# Patient Record
Sex: Male | Born: 1965 | Hispanic: Yes | Marital: Married | Smoking: Never smoker
Health system: Southern US, Community
[De-identification: ages and names within clinical notes are randomized; demographics above are authoritative.]

## PROBLEM LIST (undated history)

## (undated) DIAGNOSIS — E119 Type 2 diabetes mellitus without complications: Secondary | ICD-10-CM

## (undated) DIAGNOSIS — I10 Essential (primary) hypertension: Secondary | ICD-10-CM

---

## 2004-12-24 ENCOUNTER — Ambulatory Visit: Payer: Self-pay

## 2005-01-12 ENCOUNTER — Ambulatory Visit: Payer: Self-pay

## 2005-02-12 ENCOUNTER — Ambulatory Visit: Payer: Self-pay

## 2005-03-14 ENCOUNTER — Ambulatory Visit: Payer: Self-pay

## 2005-12-13 ENCOUNTER — Ambulatory Visit: Payer: Self-pay

## 2007-04-23 ENCOUNTER — Emergency Department: Payer: Self-pay | Admitting: Internal Medicine

## 2009-10-11 ENCOUNTER — Emergency Department: Payer: Self-pay | Admitting: Emergency Medicine

## 2011-01-10 ENCOUNTER — Emergency Department: Payer: Self-pay | Admitting: Emergency Medicine

## 2011-01-15 ENCOUNTER — Emergency Department: Payer: Self-pay | Admitting: Emergency Medicine

## 2013-09-13 ENCOUNTER — Emergency Department: Payer: Self-pay | Admitting: Emergency Medicine

## 2013-11-01 ENCOUNTER — Emergency Department: Payer: Self-pay | Admitting: Emergency Medicine

## 2013-11-01 LAB — COMPREHENSIVE METABOLIC PANEL
ALBUMIN: 3.8 g/dL (ref 3.4–5.0)
ANION GAP: 6 — AB (ref 7–16)
Alkaline Phosphatase: 70 U/L
BUN: 17 mg/dL (ref 7–18)
Bilirubin,Total: 0.2 mg/dL (ref 0.2–1.0)
CREATININE: 1.11 mg/dL (ref 0.60–1.30)
Calcium, Total: 9.1 mg/dL (ref 8.5–10.1)
Chloride: 101 mmol/L (ref 98–107)
Co2: 26 mmol/L (ref 21–32)
EGFR (African American): >60
EGFR (Non-African Amer.): >60
Glucose: 311 mg/dL — ABNORMAL HIGH (ref 65–99)
Osmolality: 280 (ref 275–301)
Potassium: 4.2 mmol/L (ref 3.5–5.1)
SGOT(AST): 26 U/L (ref 15–37)
SGPT (ALT): 46 U/L (ref 12–78)
SODIUM: 133 mmol/L — AB (ref 136–145)
Total Protein: 7.8 g/dL (ref 6.4–8.2)

## 2013-11-01 LAB — CBC
HCT: 39.7 % — AB (ref 40.0–52.0)
HGB: 13.7 g/dL (ref 13.0–18.0)
MCH: 27.8 pg (ref 26.0–34.0)
MCHC: 34.5 g/dL (ref 32.0–36.0)
MCV: 81 fL (ref 80–100)
PLATELETS: 167 10*3/uL (ref 150–440)
RBC: 4.94 10*6/uL (ref 4.40–5.90)
RDW: 13.3 % (ref 11.5–14.5)
WBC: 9.3 10*3/uL (ref 3.8–10.6)

## 2013-11-01 LAB — TROPONIN I: Troponin-I: <0.02 ng/mL

## 2015-05-02 ENCOUNTER — Encounter: Payer: Self-pay | Admitting: Emergency Medicine

## 2015-05-02 ENCOUNTER — Emergency Department: Payer: Self-pay

## 2015-05-02 ENCOUNTER — Other Ambulatory Visit: Payer: Self-pay

## 2015-05-02 ENCOUNTER — Emergency Department
Admission: EM | Admit: 2015-05-02 | Discharge: 2015-05-02 | Disposition: A | Discharge status: Home / Self Care | Payer: Self-pay | Attending: Emergency Medicine | Admitting: Emergency Medicine

## 2015-05-02 DIAGNOSIS — E119 Type 2 diabetes mellitus without complications: Secondary | ICD-10-CM | POA: Insufficient documentation

## 2015-05-02 DIAGNOSIS — G542 Cervical root disorders, not elsewhere classified: Secondary | ICD-10-CM | POA: Insufficient documentation

## 2015-05-02 DIAGNOSIS — M4802 Spinal stenosis, cervical region: Secondary | ICD-10-CM | POA: Insufficient documentation

## 2015-05-02 DIAGNOSIS — I10 Essential (primary) hypertension: Secondary | ICD-10-CM | POA: Insufficient documentation

## 2015-05-02 DIAGNOSIS — R51 Headache: Secondary | ICD-10-CM | POA: Insufficient documentation

## 2015-05-02 HISTORY — DX: Type 2 diabetes mellitus without complications: E11.9

## 2015-05-02 HISTORY — DX: Essential (primary) hypertension: I10

## 2015-05-02 MED ORDER — IBUPROFEN 800 MG PO TABS: 800.0000 mg | ORAL_TABLET | Freq: Three times a day (TID) | ORAL | Status: AC | PRN

## 2015-05-02 MED ORDER — HYDROCODONE-ACETAMINOPHEN 5-325 MG PO TABS: 1.0000 | ORAL_TABLET | ORAL | Status: DC | PRN

## 2015-05-02 NOTE — ED Provider Notes (Signed)
[facility] Emergency Department Provider Note  ____________________________________________  Time seen: Approximately 4:17 PM  I have reviewed the triage vital signs and the nursing notes. Interpretor [place]  HISTORY  Chief Complaint Neck Injury and Headache    HPI Steve Walsh is a 49 y.o. male who presents with complaints of neck and head pain times one week. Not sure why denies any trauma states that the pain is very severe. Has no known same job for 4 years with no change. Reports the pain is worse with more motion/movement. Rates pain as a 10 over 10   Past Medical History  Diagnosis Date  . Diabetes mellitus without complication   . Hypertension     There are no active problems to display for this patient.   History reviewed. No pertinent past surgical history.  Current Outpatient Rx  Name  Route  Sig  Dispense  Refill  . HYDROcodone-acetaminophen (NORCO) 5-325 MG per tablet   Oral   Take 1-2 tablets by mouth every 4 (four) hours as needed for moderate pain.   15 tablet   0   . ibuprofen (ADVIL,MOTRIN) 800 MG tablet   Oral   Take 1 tablet (800 mg total) by mouth every 8 (eight) hours as needed.   30 tablet   0     Allergies Review of patient's allergies indicates no known allergies.  History reviewed. No pertinent family history.  Social History Social History  Substance Use Topics  . Smoking status: Never Smoker   . Smokeless tobacco: None  . Alcohol Use: No    Review of Systems Constitutional: No fever/chills Eyes: No visual changes. ENT: No sore throat. Cardiovascular: Denies chest pain. Respiratory: Denies shortness of breath. Gastrointestinal: No abdominal pain.  No nausea, no vomiting.  No diarrhea.  No constipation. Genitourinary: Negative for dysuria. Musculoskeletal: Positive for neck and head pain.. Skin: Negative for rash. Neurological: Negative for headaches, focal weakness or  numbness.  10-point ROS otherwise negative.  ____________________________________________   PHYSICAL EXAM:  VITAL SIGNS: ED Triage Vitals  Enc Vitals Group     BP 05/02/15 1520 115/83 mmHg     Pulse Rate 05/02/15 1520 75     Resp 05/02/15 1520 18     Temp 05/02/15 1520 98.4 F (36.9 C)     Temp Source 05/02/15 1520 Oral     SpO2 05/02/15 1520 99 %     Weight 05/02/15 1520 162 lb (73.483 kg)     Height 05/02/15 1520  (1.753 m)     Head Cir --      Peak Flow --      Pain Score --      Pain Loc --      Pain Edu? --      Excl. in GC? --     Constitutional: Alert and oriented. Well appearing and in no acute distress. Eyes: Conjunctivae are normal. PERRL. EOMI. Head: Atraumatic. Nose: No congestion/rhinnorhea. Mouth/Throat: Mucous membranes are moist.  Oropharynx non-erythematous. Neck: No stridor.  Cervical tenderness. Full range of motion with increased pain in all directions abduction and adduction lateralization flexion and extension Cardiovascular: Normal rate, regular rhythm. Grossly normal heart sounds.  Good peripheral circulation. Respiratory: Normal respiratory effort.  No retractions. Lungs CTAB. Musculoskeletal: No lower extremity tenderness nor edema.  No joint effusions. Neurologic:  Normal speech and language. No gross focal neurologic deficits are appreciated. No gait instability. Skin:  Skin is warm, dry and intact. No rash noted.  Psychiatric: Mood and affect are normal. Speech and behavior are normal.  ____________________________________________   LABS (all labs ordered are listed, but only abnormal results are displayed)  Labs Reviewed - No data to display ____________________________________________  RADIOLOGY  Head and neck CT interpreted by radiologist reviewed by myself.   CT cervical spine: No fracture or spondylolisthesis. Calcified disc extrusion on the left at C5-6 with severe narrowing of the exit foramen on the left at C5-6. Both  of these extradural defects impress on the exiting nerve root on the left at C5-6. There is a smaller extradural defect at the exit foramen on the right at C3-4 impressing on the exiting nerve root modestly due to bony hypertrophy. No significant central stenosis appreciable.___________________________________________   PROCEDURES  Procedure(s) performed: None  Critical Care performed: No  ____________________________________________   INITIAL IMPRESSION / ASSESSMENT AND PLAN / ED COURSE  Pertinent labs & imaging results that were available during my care of the patient were reviewed by me and considered in my medical decision making (see chart for details).  Cervical stenosis with nerve impingement. We'll need to follow up with neurosurgery via PCP referral. Rx given for ibuprofen 800 mg 3 times a day and hydrocodone as needed for pain. Dischar instructions provide via interpretor Rafael.____________________________________________   FINAL CLINICAL IMPRESSION(S) / ED DIAGNOSES  Final diagnoses:  Cervical stenosis of spinal canal  Cervical nerve root impingement      Evangeline Dakin, PA-C 05/02/15 1807  Evangeline Dakin, PA-C 05/02/15 1825  Phineas Semen, MD 05/02/15 2150

## 2015-05-02 NOTE — ED Notes (Signed)
Patient to ED with report of neck and back of head pain, while he's at work feels it is caused by the [employer] at work, reports that it is very hot in his work. Further reports that pain is worse with movement.

## 2015-05-02 NOTE — Discharge Instructions (Signed)
Radiculopata cervical (Cervical Radiculopathy)  La radiculopata cervical se produce cuando un nervio del cuello se comprime o es desplazado un disco herniado o por cambios artrticos en los huesos de la columna cervical. Esto puede ocurrir debido a una lesin o como parte del proceso normal de envejecimiento. La presin [employer] nervios cervicales pueden causar dolor o adormecimiento que se extiende desde el cuello hacia los brazos y los dedos.  CAUSAS  Hay numerosas causas que [employer], entre las que se incluyen:   Traumatismos.  Rigidez de los msculos del cuello por el uso excesivo.  Articulaciones que duelen y que se hinchan (artritis).  Desgaste o degeneracin de los huesos y las articulaciones de la columna (espondilosis) debido al proceso de envejecimiento.  Espolones seos que pueden desarrollarse cerca de los nervios cervicales. SNTOMAS  Los sntomas son dolor, debilidad o adormecimiento en la mano y en el brazo afectados. El dolor puede ser intenso o irritante. Los sntomas pueden empeorar al extender o torcer el cuello.  DIAGNSTICO  El mdico le preguntar acerca de sus sntomas y le har un examen fsico. Warehouse manager de poner a prueba su fuerza y   sus reflejos. Le indicarn radiografas, una tomografa computada y Health visitor en caso de traumatismos o si los sntomas no desaparecen despus de cierto perodo de [place]. Podrn hacerle una electromiografa (EMG) o una prueba de conduccin nerviosa para estudiar el funcionamiento de sus nervios y msculos.  TRATAMIENTO  El mdico podr recomendar algunos ejercicios para [employer] sntomas. La radiculopata puede, y con frecuencia se logra, mejorar con el tiempo y un tratamiento. Si los sntomas continan, las opciones de tratamiento son:  Usar un collar blando durante un tiempo breve.  Fisioterapia para fortalecer los msculos del cuello.  Medicamentos, como los antinflamatorios no esteroideos (AINES),  corticoides por va oral o inyecciones en la columna vertebral.  Ciruga. Segn la causa del problema podrn implementarse diferentes tipos de [place]. INSTRUCCIONES PARA EL CUIDADO DOMICILIARIO  Aplique hielo sobre la zona afectada.  Ponga el hielo en una bolsa plstica.  Colquese una toalla entre la piel y la bolsa de hielo.  Deje el hielo durante 15 a 20 minutos 3 a 4 veces por da, o segn las indicaciones del mdico.  Si el hielo no ayuda, puede Charity fundraiser. Tome una ducha o bao caliente, o use una bolsa de agua caliente segn las indicaciones de su mdico.  Puede intentar con un masaje suave en el cuello y los hombros.  Por la noche duerma con una almohada plana.  Utilice los medicamentos de venta libre o de prescripcin para Chief Technology Officer, Environmental health practitioner o la [place], segn se lo indique el profesional que lo asiste.  Si le indican fisioterapia, siga las indicaciones de su mdico.  Si le indican un collar blando, selo segn las indicaciones. SOLICITE ATENCIN MDICA DE INMEDIATO SI:  El dolor empeora mucho y no puede controlarlo con medicamentos.  Siente debilidad o adormecimiento en la mano, el brazo, el rostro o la pierna.  Le sube la fiebre o tiene el cuello rgido.  Pierde el control del intestino o de la vejiga (incontinencia).  Tiene dificultad para caminar, para mantener el equilibrio o para hablar. EST SEGURO QUE:   Comprende estas instrucciones.  Controlar su enfermedad.  Solicitar ayuda de inmediato si no mejora o si empeora. Document Released: 06/10/2005 Document Revised: 11/23/2011 [facility] Patient Information 2015 [place], [place]. This information is not intended to replace advice given to you by your  health care provider. Make sure you discuss any questions you have with your health care provider.  Estenosis espinal  (Spinal Stenosis)  La estenosis espinal se produce cuando los espacios abiertos [employer] de la columna (vrtebras) se  hacen ms pequeos (se estrechan). Esto se produce por la presin de [employer] espacios abiertos de la columna. Esto ejerce presin en la columna y en los nervios de la columna. Le indicarn medicamentos para aliviar la hinchazn (inflamacin) en los nervios. En otros casos se requiere [place]. CUIDADOS EN EL HOGAR   Cambie de posicin mientras se encuentra sentado, de pie o acostado. Esto ayuda a Chief Strategy Officer presin [employer].  Haga ejercicios segn las indicaciones de su mdico para fortalecer la parte media de su cuerpo.  Baje de peso, si el mdico se lo indica. Esto quitar presin a la columna vertebral.  Tome todos los medicamentos segn las indicaciones de su mdico. ASEGRESE DE QUE:   Comprende estas instrucciones.  Controlar su afeccin.  Recibir ayuda de inmediato si no mejora o si empeora. Document Released: 01/21/2011 Document Revised: 05/03/2013 [facility] Patient Information 2015 [place], [place]. This information is not intended to replace advice given to you by your health care provider. Make sure you discuss any questions you have with your health care provider.

## 2015-05-02 NOTE — ED Notes (Signed)
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 The back of my head started hurting when I was working and I just didn't feel good." Pt denies injury. Pt c/o head pain. NAD noted. RR even and nonlabored.

## 2015-05-02 NOTE — ED Notes (Signed)
Patient transported to CT via stretcher.

## 2015-10-16 ENCOUNTER — Encounter: Payer: Self-pay | Admitting: Emergency Medicine

## 2015-10-16 ENCOUNTER — Emergency Department: Payer: Self-pay

## 2015-10-16 ENCOUNTER — Emergency Department
Admission: EM | Admit: 2015-10-16 | Discharge: 2015-10-17 | Disposition: A | Discharge status: Home / Self Care | Payer: Self-pay | Attending: Student | Admitting: Student

## 2015-10-16 DIAGNOSIS — R109 Unspecified abdominal pain: Secondary | ICD-10-CM

## 2015-10-16 DIAGNOSIS — R112 Nausea with vomiting, unspecified: Secondary | ICD-10-CM

## 2015-10-16 DIAGNOSIS — I10 Essential (primary) hypertension: Secondary | ICD-10-CM | POA: Insufficient documentation

## 2015-10-16 DIAGNOSIS — K859 Acute pancreatitis without necrosis or infection, unspecified: Secondary | ICD-10-CM | POA: Insufficient documentation

## 2015-10-16 DIAGNOSIS — E119 Type 2 diabetes mellitus without complications: Secondary | ICD-10-CM | POA: Insufficient documentation

## 2015-10-16 DIAGNOSIS — R52 Pain, unspecified: Secondary | ICD-10-CM

## 2015-10-16 LAB — TROPONIN I

## 2015-10-16 LAB — COMPREHENSIVE METABOLIC PANEL
ALT: 34 U/L (ref 17–63)
AST: 25 U/L (ref 15–41)
Albumin: 5 g/dL (ref 3.5–5.0)
Alkaline Phosphatase: 54 U/L (ref 38–126)
Anion gap: 13 (ref 5–15)
BUN: 13 mg/dL (ref 6–20)
CHLORIDE: 97 mmol/L — AB (ref 101–111)
CO2: 25 mmol/L (ref 22–32)
CREATININE: 0.83 mg/dL (ref 0.61–1.24)
Calcium: 10.6 mg/dL — ABNORMAL HIGH (ref 8.9–10.3)
GFR calc non Af Amer: >60 mL/min (ref >60)
Glucose, Bld: 308 mg/dL — ABNORMAL HIGH (ref 65–99)
POTASSIUM: 4.4 mmol/L (ref 3.5–5.1)
SODIUM: 135 mmol/L (ref 135–145)
Total Bilirubin: 0.5 mg/dL (ref 0.3–1.2)
Total Protein: 8.8 g/dL — ABNORMAL HIGH (ref 6.5–8.1)

## 2015-10-16 LAB — URINALYSIS COMPLETE WITH MICROSCOPIC (ARMC ONLY)
BACTERIA UA: NONE SEEN
BILIRUBIN URINE: NEGATIVE
Glucose, UA: >500 mg/dL — AB
Hgb urine dipstick: NEGATIVE
Ketones, ur: NEGATIVE mg/dL
Nitrite: NEGATIVE
PH: 8 (ref 5.0–8.0)
PROTEIN: NEGATIVE mg/dL
RBC / HPF: NONE SEEN RBC/hpf (ref 0–5)
SQUAMOUS EPITHELIAL / LPF: NONE SEEN
Specific Gravity, Urine: 1.02 (ref 1.005–1.030)

## 2015-10-16 LAB — CBC
HEMATOCRIT: 42.7 % (ref 40.0–52.0)
Hemoglobin: 14.4 g/dL (ref 13.0–18.0)
MCH: 27.2 pg (ref 26.0–34.0)
MCHC: 33.7 g/dL (ref 32.0–36.0)
MCV: 80.8 fL (ref 80.0–100.0)
PLATELETS: 179 10*3/uL (ref 150–440)
RBC: 5.28 MIL/uL (ref 4.40–5.90)
RDW: 13.1 % (ref 11.5–14.5)
WBC: 5.5 10*3/uL (ref 3.8–10.6)

## 2015-10-16 LAB — LIPASE, BLOOD: LIPASE: 77 U/L — AB (ref 11–51)

## 2015-10-16 MED ORDER — OXYCODONE HCL 5 MG PO TABS: 5.0000 mg | ORAL_TABLET | Freq: Four times a day (QID) | ORAL | Status: AC | PRN

## 2015-10-16 MED ORDER — FENTANYL CITRATE (PF) 100 MCG/2ML IJ SOLN
50.0000 ug | Freq: Once | INTRAMUSCULAR | Status: AC
  Administered 2015-10-16: 50 ug via INTRAVENOUS
  Filled 2015-10-16: qty 2

## 2015-10-16 MED ORDER — ONDANSETRON HCL 4 MG PO TABS: 4.0000 mg | ORAL_TABLET | Freq: Three times a day (TID) | ORAL | Status: AC | PRN

## 2015-10-16 MED ORDER — ONDANSETRON HCL 4 MG/2ML IJ SOLN
4.0000 mg | Freq: Once | INTRAMUSCULAR | Status: AC | PRN
  Administered 2015-10-16: 4 mg via INTRAVENOUS
  Filled 2015-10-16: qty 2

## 2015-10-16 NOTE — ED Notes (Signed)
Assessment done through interpreter

## 2015-10-16 NOTE — ED Notes (Signed)
Lab called for add on of trop 

## 2015-10-16 NOTE — ED Provider Notes (Addendum)
[facility] Emergency Department Provider Note  ____________________________________________  Time seen: Approximately 9:01 PM  I have reviewed the triage vital signs and the nursing notes.   HISTORY  Chief Complaint Abdominal Pain and Emesis    HPI Steve Walsh is a 50 y.o. male with diabetes and hypertension presents for evaluation of diffuse severe abdominal pain, gradual onset, initially severe, now resolved, no modifying factors. The patient reports that this evening almost immediately after eating he developed severe abdominal pain and 3 episodes of nonbloody bilious emesis. No diarrhea. No fevers. He denies any chest pain at that time but reports that his right arm felt "sleepy". That is also resolved. No headache, no weakness, no recent illness including no cough, fevers or chills.   Past Medical History  Diagnosis Date  . Diabetes mellitus without complication (HCC)   . Hypertension     There are no active problems to display for this patient.   History reviewed. No pertinent past surgical history.  Current Outpatient Rx  Name  Route  Sig  Dispense  Refill  . HYDROcodone-acetaminophen (NORCO) 5-325 MG per tablet   Oral   Take 1-2 tablets by mouth every 4 (four) hours as needed for moderate pain.   15 tablet   0   . ibuprofen (ADVIL,MOTRIN) 800 MG tablet   Oral   Take 1 tablet (800 mg total) by mouth every 8 (eight) hours as needed.   30 tablet   0     Allergies Review of patient's allergies indicates no known allergies.  No family history on file.  Social History Social History  Substance Use Topics  . Smoking status: Never Smoker   . Smokeless tobacco: None  . Alcohol Use: No    Review of Systems Constitutional: No fever/chills Eyes: No visual changes. ENT: No sore throat. Cardiovascular: Denies chest pain. Respiratory: Denies shortness of breath. Gastrointestinal: + abdominal pain.  + nausea, + vomiting.  No  diarrhea.  No constipation. Genitourinary: Negative for dysuria. Musculoskeletal: Negative for back pain. Skin: Negative for rash. Neurological: Negative for headaches, focal weakness or numbness.  10-point ROS otherwise negative.  ____________________________________________   PHYSICAL EXAM:  VITAL SIGNS: ED Triage Vitals  Enc Vitals Group     BP 10/16/15 1753 161/86 mmHg     Pulse Rate 10/16/15 1753 83     Resp 10/16/15 1753 20     Temp 10/16/15 1753 98.2 F (36.8 C)     Temp Source 10/16/15 1753 Oral     SpO2 10/16/15 1753 97 %     Weight 10/16/15 1753 178 lb (80.74 kg)     Height 10/16/15 1753 5' 2.21" (1.58 m)     Head Cir --      Peak Flow --      Pain Score 10/16/15 1754 8     Pain Loc --      Pain Edu? --      Excl. in GC? --     Constitutional: Alert and oriented. Well appearing and in no acute distress. Eyes: Conjunctivae are normal. PERRL. EOMI. Head: Atraumatic. Nose: No congestion/rhinnorhea. Mouth/Throat: Mucous membranes are moist.  Oropharynx non-erythematous. Neck: No stridor.  Cardiovascular: Normal rate, regular rhythm. Grossly normal heart sounds.  Good peripheral circulation. Respiratory: Normal respiratory effort.  No retractions. Lungs CTAB. Gastrointestinal: Soft and nontender. No distention.  No CVA tenderness. Genitourinary: deferred Musculoskeletal: No lower extremity tenderness nor edema.  No joint effusions. Neurologic:  Normal speech and language. No gross  focal neurologic deficits are appreciated. No gait instability. 5 out of 5 strength in bilateral upper and lower extremities, sensation intact to light touch throughout. Skin:  Skin is warm, dry and intact. No rash noted. Psychiatric: Mood and affect are normal. Speech and behavior are normal.  ____________________________________________   LABS (all labs ordered are listed, but only abnormal results are displayed)  Labs Reviewed  LIPASE, BLOOD - Abnormal; Notable for the  following:    Lipase 77 (*)    All other components within normal limits  COMPREHENSIVE METABOLIC PANEL - Abnormal; Notable for the following:    Chloride 97 (*)    Glucose, Bld 308 (*)    Calcium 10.6 (*)    Total Protein 8.8 (*)    All other components within normal limits  URINALYSIS COMPLETEWITH MICROSCOPIC ([facility] ONLY) - Abnormal; Notable for the following:    Color, Urine YELLOW (*)    APPearance CLEAR (*)    Glucose, UA >500 (*)    Leukocytes, UA TRACE (*)    All other components within normal limits  CBC  TROPONIN I   ____________________________________________  EKG  ED ECG REPORT I, Gayla Doss, the attending physician, personally viewed and interpreted this ECG.   Date: 10/16/2015  EKG Time: 17:53  Rate: 80  Rhythm: normal EKG, normal sinus rhythm  Axis: normal  Intervals:none  ST&T Change: No acute ST elevation.  ____________________________________________  RADIOLOGY  RUQ ultrasound IMPRESSION: Normal sonographic appearance of the gallbladder and biliary tree. ____________________________________________   PROCEDURES  Procedure(s) performed: None  Critical Care performed: No  ____________________________________________   INITIAL IMPRESSION / ASSESSMENT AND PLAN / ED COURSE  Pertinent labs & imaging results that were available during my care of the patient were reviewed by me and considered in my medical decision making (see chart for details).  [name] Late [name] is a 50 y.o. male with diabetes and hypertension presents for evaluation of diffuse severe abdominal pain as well as vomiting now resolved. On exam, he is very well-appearing and in no acute distress. His abdominal exam is benign. Vital signs stable, he is afebrile. All information is obtained with the use of the Spanish interpreter. Labs reviewed. Lipase is elevated at 77. This may be pancreatitis. No history of alcohol use. Right upper quadrant ultrasound is pending to evaluate  for acute gallbladder pathology. CBC and CMP are generally unremarkable. Urinalysis is not consistent with infection. EKG is reassuring. He has an intact neurological examination and I doubt that his arm "feeling sleepy" represented stroke or TIA.  ----------------------------------------- 10:48 PM on 10/16/2015 ----------------------------------------- Troponin negative. Doubt atypical ACS presentation. It upper quadrant ultrasound is negative. I discussed with him that his symptoms may be secondary to pancreatitis but as his pain is well controlled he is tolerating by mouth intake and he feels well, we'll plan to discharge with oxycodone and Zofran. He will follow up with his primary care doctor within the next 2-3 days. We discussed meticulous return precautions including immediate return precautions and he is comfortable with the discharge plan. DC home.  ____________________________________________   FINAL CLINICAL IMPRESSION(S) / ED DIAGNOSES  Final diagnoses:  Pain  Abdominal pain, unspecified abdominal location  Non-intractable vomiting with nausea, vomiting of unspecified type  Acute pancreatitis, unspecified pancreatitis type      Gayla Doss, MD 10/16/15 2251  Gayla Doss, MD 10/16/15 2251

## 2015-10-16 NOTE — ED Notes (Signed)
Patient presents to the ED with right flank pain and severe abdominal pain that started about 1 hour ago.  Patient states pain was worse after eating.  Patient reports vomiting x 3 in the past hour.  Patient is diaphoretic and holding his abdomen.  Patient reports an episode with the same symptoms about 6 months ago.  Patient cannot remember what he was diagnosed with.  [employer], spanish interpreter was used)

## 2015-10-17 NOTE — ED Notes (Signed)
Discharge instruction via interpreter at bedside

## 2015-11-07 ENCOUNTER — Emergency Department
Admission: EM | Admit: 2015-11-07 | Discharge: 2015-11-07 | Disposition: A | Discharge status: Home / Self Care | Payer: Self-pay | Attending: Emergency Medicine | Admitting: Emergency Medicine

## 2015-11-07 ENCOUNTER — Encounter: Payer: Self-pay | Admitting: Emergency Medicine

## 2015-11-07 DIAGNOSIS — K2970 Gastritis, unspecified, without bleeding: Secondary | ICD-10-CM | POA: Insufficient documentation

## 2015-11-07 DIAGNOSIS — E119 Type 2 diabetes mellitus without complications: Secondary | ICD-10-CM | POA: Insufficient documentation

## 2015-11-07 DIAGNOSIS — I10 Essential (primary) hypertension: Secondary | ICD-10-CM | POA: Insufficient documentation

## 2015-11-07 LAB — CBC WITH DIFFERENTIAL/PLATELET
Basophils Absolute: 0 10*3/uL (ref 0–0.1)
Basophils Relative: 1 %
EOS ABS: 0 10*3/uL (ref 0–0.7)
Eosinophils Relative: 1 %
HEMATOCRIT: 38.4 % — AB (ref 40.0–52.0)
HEMOGLOBIN: 13.3 g/dL (ref 13.0–18.0)
LYMPHS ABS: 1.2 10*3/uL (ref 1.0–3.6)
LYMPHS PCT: 29 %
MCH: 27.3 pg (ref 26.0–34.0)
MCHC: 34.7 g/dL (ref 32.0–36.0)
MCV: 78.7 fL — AB (ref 80.0–100.0)
MONOS PCT: 6 %
Monocytes Absolute: 0.2 10*3/uL (ref 0.2–1.0)
NEUTROS ABS: 2.6 10*3/uL (ref 1.4–6.5)
NEUTROS PCT: 63 %
Platelets: 174 10*3/uL (ref 150–440)
RBC: 4.88 MIL/uL (ref 4.40–5.90)
RDW: 12.8 % (ref 11.5–14.5)
WBC: 4.1 10*3/uL (ref 3.8–10.6)

## 2015-11-07 LAB — COMPREHENSIVE METABOLIC PANEL
ALT: 29 U/L (ref 17–63)
AST: 30 U/L (ref 15–41)
Albumin: 4.6 g/dL (ref 3.5–5.0)
Alkaline Phosphatase: 43 U/L (ref 38–126)
Anion gap: 9 (ref 5–15)
BUN: 15 mg/dL (ref 6–20)
CHLORIDE: 105 mmol/L (ref 101–111)
CO2: 23 mmol/L (ref 22–32)
CREATININE: 0.71 mg/dL (ref 0.61–1.24)
Calcium: 9.9 mg/dL (ref 8.9–10.3)
GFR calc non Af Amer: >60 mL/min (ref >60)
Glucose, Bld: 224 mg/dL — ABNORMAL HIGH (ref 65–99)
POTASSIUM: 4.4 mmol/L (ref 3.5–5.1)
SODIUM: 137 mmol/L (ref 135–145)
Total Bilirubin: 0.6 mg/dL (ref 0.3–1.2)
Total Protein: 7.7 g/dL (ref 6.5–8.1)

## 2015-11-07 LAB — TROPONIN I: Troponin I: <0.03 ng/mL (ref <0.031)

## 2015-11-07 LAB — LIPASE, BLOOD: Lipase: 41 U/L (ref 11–51)

## 2015-11-07 MED ORDER — GI COCKTAIL ~~LOC~~
30.0000 mL | Freq: Once | ORAL | Status: AC
  Administered 2015-11-07: 30 mL via ORAL
  Filled 2015-11-07: qty 30

## 2015-11-07 MED ORDER — FAMOTIDINE 20 MG PO TABS: 20.0000 mg | ORAL_TABLET | Freq: Two times a day (BID) | ORAL | Status: AC

## 2015-11-07 MED ORDER — ONDANSETRON HCL 4 MG/2ML IJ SOLN
4.0000 mg | Freq: Once | INTRAMUSCULAR | Status: AC
  Administered 2015-11-07: 4 mg via INTRAVENOUS
  Filled 2015-11-07: qty 2

## 2015-11-07 NOTE — ED Notes (Signed)
Reports abd pain in middle since 6am, n/v x 3

## 2015-11-07 NOTE — Discharge Instructions (Signed)
Gastritis - Adultos   (Gastritis, Adult)   Gastrittis es la hinchazón e irritación (inflamación) del revestimiento interno del estómago. Si no recibe tratamiento, la gastritis puede causar sangrado y llagas.(úlceras) en el estómago.  CUIDADOS EN EL HOGAR   · Sólo tome los medicamentos según le indique el médico.  · Si le han recetado antibióticos, tómelos según las indicaciones. Termine de tomar el medicamento, aunque comience a sentirse mejor.  · Beba gran cantidad de líquido para mantener el pis (orina) de tono claro o amarillo pálido.  · Evite las comidas y bebidas que empeoran los problemas. Los alimentos que debe evitar son:  ¨ Cafeína y alcohol.  ¨ Chocolate.  ¨ Menta.  ¨ Ajo y cebolla.  ¨ Comidas muy condimentadas.  ¨ Cítricos como naranjas, limones o limas.  ¨ Alimentos que contengan tomate, como salsas, chile y pizza.  ¨ Alimentos fritos y grasos.  · Haga comidas pequeñas durante el día en lugar de 3 comidas abundantes.  SOLICITE AYUDA DE INMEDIATO SI:   · La materia fecal (heces)es negra o de color rojo oscuro.  · Vomita sangre. Puede ser similar a la borra del café  · No puede retener los líquidos.  · El dolor en el vientre (abdominal) empeora.  · Tiene fiebre.  · No mejora luego de 1 semana.  · Tiene preguntas o preocupaciones.  ASEGÚRESE DE QUE:   · Comprende estas instrucciones.  · Controlará su enfermedad.  · Solicitará ayuda de inmediato si no mejora o si empeora.     Esta información no tiene como fin reemplazar el consejo del médico. Asegúrese de hacerle al médico cualquier pregunta que tenga.     Document Released: 03/01/2012  Elsevier Interactive Patient Education ©2016 Elsevier Inc.

## 2015-11-07 NOTE — ED Notes (Signed)
MD at bedside. 

## 2015-11-07 NOTE — ED Provider Notes (Addendum)
[facility] Emergency Department Provider Note  ____________________________________________   I have reviewed the triage vital signs and the nursing notes.   HISTORY  Chief Complaint Abdominal Pain    HPI Steve Walsh is a 50 y.o. male a history of chronic epigastric abdominal pain, medical endoscopy colonoscopy in the past, 20/15, presents for second time this month with epigastric abdominal pain and vomiting. He denies any chest pain or shortness of breath. Pain is sometimes worse with food. He does not take any antacids. He does have a history of gastric reflux. He has had no chest pain or shortness of breath. No exertional symptoms. He states the pain is in his epigastric region. As the same pain that he had when he was here before. Began gradually last night. Associated with vomiting. Has had no melena or bright red blood per rectum. Normal bowel movements. Denies diarrhea. Denies fever. Aside from food nothing makes the pain worse. Nothing makes it better. Patient states this is exact same pain he was here for 2 weeks ago. At that time he had a slight elevation in his lipase. He states he does not drink alcohol. He does take an occasional Motrin. He has had no hematemesis.   Past Medical History  Diagnosis Date  . Diabetes mellitus without complication (HCC)   . Hypertension     There are no active problems to display for this patient.   History reviewed. No pertinent past surgical history.  Current Outpatient Rx  Name  Route  Sig  Dispense  Refill  . ibuprofen (ADVIL,MOTRIN) 800 MG tablet   Oral   Take 1 tablet (800 mg total) by mouth every 8 (eight) hours as needed.   30 tablet   0   . ondansetron (ZOFRAN) 4 MG tablet   Oral   Take 1 tablet (4 mg total) by mouth every 8 (eight) hours as needed for nausea or vomiting.   12 tablet   0   . oxyCODONE (ROXICODONE) 5 MG immediate release tablet   Oral   Take 1 tablet (5 mg total) by  mouth every 6 (six) hours as needed for moderate pain. Do not drive while taking this medication.   12 tablet   0     Allergies Review of patient's allergies indicates no known allergies.  History reviewed. No pertinent family history.  Social History Social History  Substance Use Topics  . Smoking status: Never Smoker   . Smokeless tobacco: None  . Alcohol Use: No    Review of Systems Constitutional: No fever/chills Eyes: No visual changes. ENT: No sore throat. No stiff neck no neck pain Cardiovascular: Denies chest pain. Respiratory: Denies shortness of breath. Gastrointestinal:  Positive vomiting.  No diarrhea.  No constipation. Genitourinary: Negative for dysuria. Musculoskeletal: Negative lower extremity swelling Skin: Negative for rash. Neurological: Negative for headaches, focal weakness or numbness. 10-point ROS otherwise negative.  ____________________________________________   PHYSICAL EXAM:  VITAL SIGNS: ED Triage Vitals  Enc Vitals Group     BP 11/07/15 1111 128/92 mmHg     Pulse Rate 11/07/15 1111 81     Resp 11/07/15 1111 20     Temp 11/07/15 1111 98 F (36.7 C)     Temp Source 11/07/15 1111 Oral     SpO2 11/07/15 1111 98 %     Weight 11/07/15 1111 176 lb (79.833 kg)     Height 11/07/15 1111  (1.651 m)     Head Cir --  Peak Flow --      Pain Score 11/07/15 1113 9     Pain Loc --      Pain Edu? --      Excl. in GC? --     Constitutional: Alert and oriented. Well appearing and in no acute distress. Eyes: Conjunctivae are normal. PERRL. EOMI. Head: Atraumatic. Nose: No congestion/rhinnorhea. Mouth/Throat: Mucous membranes are moist.  Oropharynx non-erythematous. Neck: No stridor.   Nontender with no meningismus Cardiovascular: Normal rate, regular rhythm. Grossly normal heart sounds.  Good peripheral circulation. Respiratory: Normal respiratory effort.  No retractions. Lungs CTAB. Abdominal: Soft andwith epigastric abdominal  discomfort which reproduces his pain.. No distention. No guarding no rebound Back:  There is no focal tenderness or step off there is no midline tenderness there are no lesions noted. there is no CVA tenderness Musculoskeletal: No lower extremity tenderness. No joint effusions, no DVT signs strong distal pulses no edema Neurologic:  Normal speech and language. No gross focal neurologic deficits are appreciated.  Skin:  Skin is warm, dry and intact. No rash noted. Psychiatric: Mood and affect are normal. Speech and behavior are normal.  ____________________________________________   LABS (all labs ordered are listed, but only abnormal results are displayed)  Labs Reviewed  COMPREHENSIVE METABOLIC PANEL - Abnormal; Notable for the following:    Glucose, Bld 224 (*)    All other components within normal limits  CBC WITH DIFFERENTIAL/PLATELET - Abnormal; Notable for the following:    HCT 38.4 (*)    MCV 78.7 (*)    All other components within normal limits  LIPASE, BLOOD   ____________________________________________  EKG  I personally interpreted any EKGs ordered by me or triage Normal sinus rhythm rate 79 bpm no acute ST elevation or acute ST depression unremarkable EKG  ____________________________________________  RADIOLOGY  I reviewed any imaging ordered by me or triage that were performed during my shift ____________________________________________   PROCEDURES  Procedure(s) performed: None  Critical Care performed: None  ____________________________________________   INITIAL IMPRESSION / ASSESSMENT AND PLAN / ED COURSE  Pertinent labs & imaging results that were available during my care of the patient were reviewed by me and considered in my medical decision making (see chart for details).  Patient seen and evaluated with the interpreter. Patient with recurrent chronic epigastric abdominal pain. Abdomen is benign at this time. No indication of surgical pathology.  CG is reassuring, do not think that this recurrent epigastric abdominal reproducible pain is representative of ACS PE dissection or other intrathoracic pathology. Nor do I think the patient is suffering from a AAA or any vascular test 3 (E. Nothing at this time to indicate appendicitis either. In addition, Patient had a normal ultrasound of his gallbladder and liver 2 weeks ago. There is nothing at this time to suggest acute hepatitis, choledocholithiasis, biliary colic or other hepato-vesicular pathology. My plan at this time is to give the patient a GI cocktail to see if this helps his discomfort, anti-emetics, IV fluids and reassess   ----------------------------------------- 2:40 PM on 11/07/2015 -----------------------------------------  After GI cocktail, all the patient's pain completely went away.At this time, there does not appear to be clinical evidence to support the diagnosis of pulmonary embolus, dissection, myocarditis, endocarditis, pericarditis, pericardial tamponade, acute coronary syndrome, pneumothorax, pneumonia, or any other acute intrathoracic pathology that will require admission or acute intervention. Nor is there evidence of any significant intra-abdominal pathology causing this discomfort. Serial abdominal exams are completely benign and he is eager to go home.  We will start him on antiacids and have him follow closely with his doctor. Return precautions and follow-up given and understood.Patient is very comfortable with the discharge plan. Customary extensive return precautions and follow-up explained to and understood by the patient.  Questions answered. ____________________________________________   FINAL CLINICAL IMPRESSION(S) / ED DIAGNOSES  Final diagnoses:  None      This chart was dictated using voice recognition software.  Despite best efforts to proofread,  errors can occur which can change meaning.     Jeanmarie Plant, MD 11/07/15 1311  Jeanmarie Plant,  MD 11/07/15 1440

## 2016-06-05 ENCOUNTER — Emergency Department: Payer: Self-pay

## 2016-06-05 DIAGNOSIS — Z79899 Other long term (current) drug therapy: Secondary | ICD-10-CM | POA: Insufficient documentation

## 2016-06-05 DIAGNOSIS — Z791 Long term (current) use of non-steroidal anti-inflammatories (NSAID): Secondary | ICD-10-CM | POA: Insufficient documentation

## 2016-06-05 DIAGNOSIS — R0789 Other chest pain: Secondary | ICD-10-CM | POA: Insufficient documentation

## 2016-06-05 DIAGNOSIS — E119 Type 2 diabetes mellitus without complications: Secondary | ICD-10-CM | POA: Insufficient documentation

## 2016-06-05 DIAGNOSIS — I10 Essential (primary) hypertension: Secondary | ICD-10-CM | POA: Insufficient documentation

## 2016-06-05 DIAGNOSIS — R0602 Shortness of breath: Secondary | ICD-10-CM | POA: Insufficient documentation

## 2016-06-05 LAB — CBC
HEMATOCRIT: 39.9 % — AB (ref 40.0–52.0)
HEMOGLOBIN: 13.9 g/dL (ref 13.0–18.0)
MCH: 27.8 pg (ref 26.0–34.0)
MCHC: 35 g/dL (ref 32.0–36.0)
MCV: 79.4 fL — ABNORMAL LOW (ref 80.0–100.0)
Platelets: 182 10*3/uL (ref 150–440)
RBC: 5.02 MIL/uL (ref 4.40–5.90)
RDW: 13.1 % (ref 11.5–14.5)
WBC: 11 10*3/uL — ABNORMAL HIGH (ref 3.8–10.6)

## 2016-06-05 LAB — GLUCOSE, CAPILLARY: Glucose-Capillary: 72 mg/dL (ref 65–99)

## 2016-06-05 NOTE — ED Notes (Signed)
FIRST NURSE NOTE: Patient presents to the ED via EMS from church. EMS called out in reference to patient having some SOB. Patient's symptoms have resolved at this point, however he is here now "just to be checked out to be sure nothing is wrong". EMS reports that 12-lead EKG showed SR with a first degree AVB; no known history of this.

## 2016-06-05 NOTE — ED Triage Notes (Signed)
EMS pt to triage via wheelchair. Pt reports via [facility] interpreter Rafel that he was sitting at church when he developed sudden shortness of breath and then pain to his left chest over his heart. Pt reports both have resolved at this time and had lasted about 15 mins. Pt talking in full and complete sentences at this time with no difficulty.

## 2016-06-06 ENCOUNTER — Emergency Department
Admission: EM | Admit: 2016-06-06 | Discharge: 2016-06-06 | Disposition: A | Discharge status: Home / Self Care | Payer: Self-pay | Attending: Emergency Medicine | Admitting: Emergency Medicine

## 2016-06-06 DIAGNOSIS — R079 Chest pain, unspecified: Secondary | ICD-10-CM

## 2016-06-06 LAB — BASIC METABOLIC PANEL
ANION GAP: 8 (ref 5–15)
BUN: 19 mg/dL (ref 6–20)
CHLORIDE: 105 mmol/L (ref 101–111)
CO2: 26 mmol/L (ref 22–32)
Calcium: 9.5 mg/dL (ref 8.9–10.3)
Creatinine, Ser: 0.82 mg/dL (ref 0.61–1.24)
GFR calc Af Amer: >60 mL/min (ref >60)
GLUCOSE: 71 mg/dL (ref 65–99)
POTASSIUM: 3.3 mmol/L — AB (ref 3.5–5.1)
Sodium: 139 mmol/L (ref 135–145)

## 2016-06-06 LAB — TROPONIN I: Troponin I: <0.03 ng/mL (ref <0.03)

## 2016-06-06 NOTE — ED Notes (Signed)
Used interpreter on a stick to assess pt.  Pt c/o SOB, bilateral shoulder pain radiating to back, weakness/dizziness/lightheadedness, and diaphoresis while at church yesterday. Pt reports symptoms have since resolved. Pt denies ever having had chest pain/discomfort. Pt denies any current symptoms.

## 2016-06-06 NOTE — ED Notes (Signed)
Reviewed d/c instructions and follow-up care with pt with help of translator [place]. Pt verbalized understanding

## 2016-06-11 NOTE — ED Provider Notes (Signed)
[facility] Emergency Department Provider Note   First MD Initiated Contact with Patient 06/06/16 [phone]     (approximate)  I have reviewed the triage vital signs and the nursing notes.   HISTORY  Chief Complaint Chest Pain and Shortness of Breath   HPI Steve Walsh is a 50 y.o. male with history of hypertension diabetes presents to the emergency department with sudden onset of shortness of breath followed by left chest discomfort which lasted approximately 15 minutes followed by spontaneous resolution. Patient has no complaints at this time. No chest pain nausea vomiting dyspnea and diaphoresis.   Past Medical History:  Diagnosis Date  . Diabetes mellitus without complication (HCC)   . Hypertension     There are no active problems to display for this patient.   No past surgical history on file.  Prior to Admission medications   Medication Sig Start Date End Date Taking? Authorizing Provider  famotidine (PEPCID) 20 MG tablet Take 1 tablet (20 mg total) by mouth 2 (two) times daily. 11/07/15 11/06/16  Jeanmarie PlantJames A McShane, MD  ibuprofen (ADVIL,MOTRIN) 800 MG tablet Take 1 tablet (800 mg total) by mouth every 8 (eight) hours as needed. 05/02/15   Charmayne Sheerharles M Beers, PA-C  ondansetron (ZOFRAN) 4 MG tablet Take 1 tablet (4 mg total) by mouth every 8 (eight) hours as needed for nausea or vomiting. 10/16/15   Gayla DossEryka A Gayle, MD  oxyCODONE (ROXICODONE) 5 MG immediate release tablet Take 1 tablet (5 mg total) by mouth every 6 (six) hours as needed for moderate pain. Do not drive while taking this medication. 10/16/15   Gayla DossEryka A Gayle, MD    Allergies No known drug allergies No family history on file.  Social History Social History  Substance Use Topics  . Smoking status: Never Smoker  . Smokeless tobacco: Not on file  . Alcohol use No    Review of Systems Constitutional: No fever/chills Eyes: No visual changes. ENT: No sore throat. Cardiovascular:  Positive for chest pain. Respiratory: Positive for shortness of breath. Gastrointestinal: No abdominal pain.  No nausea, no vomiting.  No diarrhea.  No constipation. Genitourinary: Negative for dysuria. Musculoskeletal: Negative for back pain. Skin: Negative for rash. Neurological: Negative for headaches, focal weakness or numbness.  10-point ROS otherwise negative.  ____________________________________________   PHYSICAL EXAM:  VITAL SIGNS: ED Triage Vitals  Enc Vitals Group     BP 06/05/16 2328 118/69     Pulse Rate 06/05/16 2328 71     Resp 06/05/16 2328 18     Temp 06/05/16 2328 97.7 F (36.5 C)     Temp Source 06/05/16 2328 Oral     SpO2 06/05/16 2328 98 %     Weight 06/05/16 2328 178 lb (80.7 kg)     Height --      Head Circumference --      Peak Flow --      Pain Score 06/05/16 2335 2     Pain Loc --      Pain Edu? --      Excl. in GC? --     Constitutional: Alert and oriented. Well appearing and in no acute distress. Eyes: Conjunctivae are normal. PERRL. EOMI. Head: Atraumatic. Mouth/Throat: Mucous membranes are moist.  Oropharynx non-erythematous. Neck: No stridor.  No meningeal signs.   Cardiovascular: Normal rate, regular rhythm. Good peripheral circulation. Grossly normal heart sounds. Respiratory: Normal respiratory effort.  No retractions. Lungs CTAB. Gastrointestinal: Soft and nontender. No distention.  Musculoskeletal:  No lower extremity tenderness nor edema. No gross deformities of extremities. Neurologic:  Normal speech and language. No gross focal neurologic deficits are appreciated.  Skin:  Skin is warm, dry and intact. No rash noted. Psychiatric: Mood and affect are normal. Speech and behavior are normal.  ____________________________________________   LABS (all labs ordered are listed, but only abnormal results are displayed)  Labs Reviewed  BASIC METABOLIC PANEL - Abnormal; Notable for the following:       Result Value   Potassium 3.3 (*)     All other components within normal limits  CBC - Abnormal; Notable for the following:    WBC 11.0 (*)    HCT 39.9 (*)    MCV 79.4 (*)    All other components within normal limits  GLUCOSE, CAPILLARY  TROPONIN I  TROPONIN I   ____________________________________________  EKG  ED ECG REPORT I, [place] N BROWN, the attending physician, personally viewed and interpreted this ECG.   Date: 06/11/2016  EKG Time: 11:27 PM  Rate: 74  Rhythm: Normal sinus rhythm  Axis: Normal  Intervals: Normal  ST&T Change: None  ____________________________________________  RADIOLOGY I, [place] N BROWN, personally viewed and evaluated these images (plain radiographs) as part of my medical decision making, as well as reviewing the written report by the radiologist.  CLINICAL DATA:  Acute onset of generalized chest pain and shortness of breath. Initial encounter.  EXAM: CHEST  2 VIEW  COMPARISON:  CTA of the chest and chest radiograph performed 11/01/2013  FINDINGS: The lungs are well-aerated and clear. There is no evidence of focal opacification, pleural effusion or pneumothorax.  The heart is normal in size; the mediastinal contour is within normal limits. No acute osseous abnormalities are seen.  IMPRESSION: No acute cardiopulmonary process seen.   Electronically Signed   By: Roanna Raider M.D.   On: 06/05/2016 23:59    Procedures   INITIAL IMPRESSION / ASSESSMENT AND PLAN / ED COURSE  Pertinent labs & imaging results that were available during my care of the patient were reviewed by me and considered in my medical decision making (see chart for details).  50 year old male who presented to the emergency department with 15 minutes of chest pain and shortness of breath followed by spontaneous resolution. Consider possible cardiac etiology for the patient's chest pain however EKG revealed no evidence of ischemia or infarction. Troponin performed 2 negative. Patient  referred to cardiologist for further outpatient evaluation   Clinical Course    ____________________________________________  FINAL CLINICAL IMPRESSION(S) / ED DIAGNOSES  Final diagnoses:  Chest pain, unspecified chest pain type     MEDICATIONS GIVEN DURING THIS VISIT:  Medications - No data to display   NEW OUTPATIENT MEDICATIONS STARTED DURING THIS VISIT:  Discharge Medication List as of 06/06/2016  5:06 AM      Discharge Medication List as of 06/06/2016  5:06 AM      Discharge Medication List as of 06/06/2016  5:06 AM       Note:  This document was prepared using Dragon voice recognition software and may include unintentional dictation errors.    Darci Current, MD 06/11/16 [phone]

## 2018-07-19 IMAGING — CR DG CHEST 2V
2 series · 2 of 2 positions shown · non-contrast
Comparison: CTA of the chest and chest radiograph performed
11/01/2013

CLINICAL DATA: Acute onset of generalized chest pain and shortness
of breath. Initial encounter.

EXAM:
CHEST  2 VIEW

[chest pa]
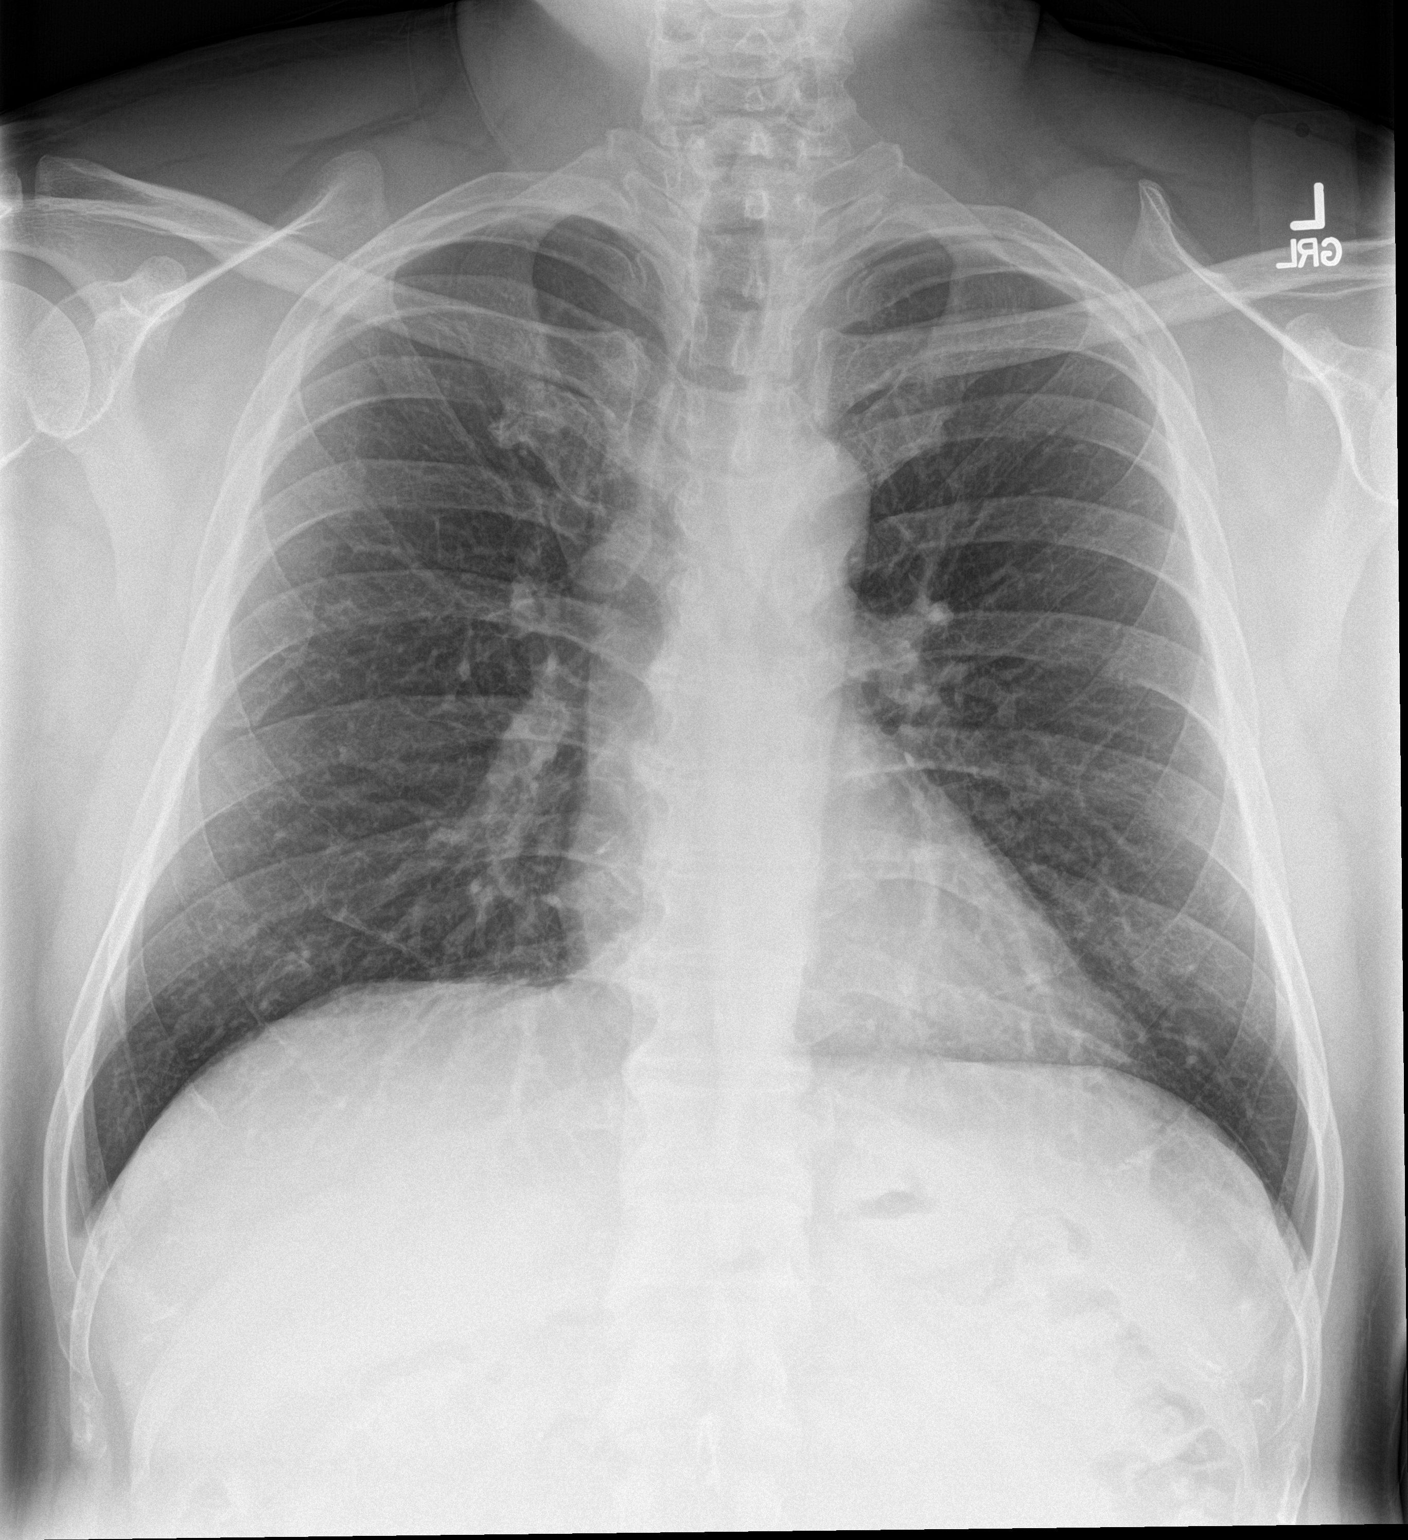

[chest lat]
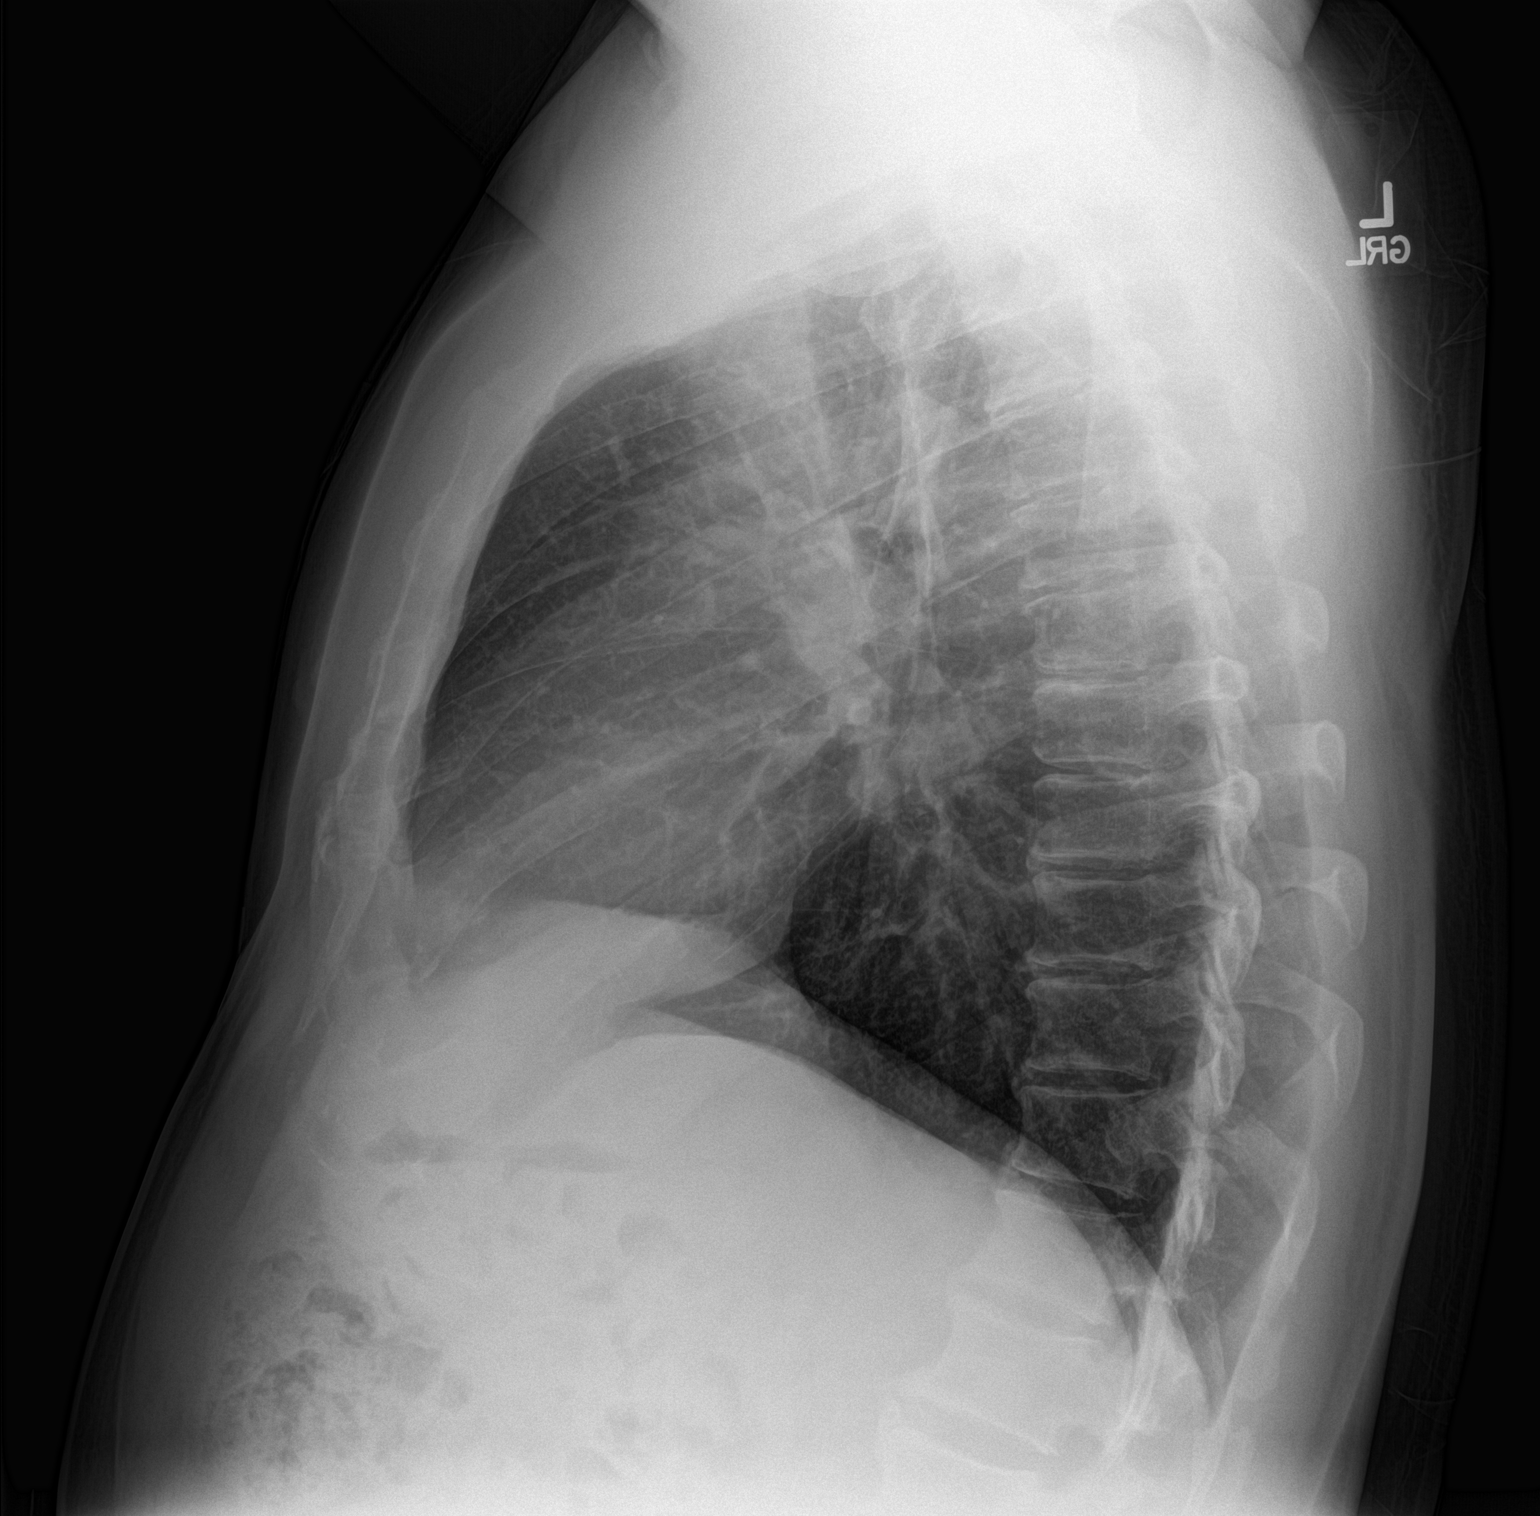

[2 of 2 positions shown; findings below may reference images not displayed]

FINDINGS: The lungs are well-aerated and clear. There is no evidence of focal
opacification, pleural effusion or pneumothorax.

The heart is normal in size; the mediastinal contour is within
normal limits. No acute osseous abnormalities are seen.
IMPRESSION: No acute cardiopulmonary process seen.
